# Patient Record
Sex: Male | Born: 1990 | Race: Black or African American | Hispanic: No | Marital: Single | Smoking: Never smoker
Health system: Southern US, Community
[De-identification: ages and names within clinical notes are randomized; demographics above are authoritative.]

---
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
## 2015-09-12 ENCOUNTER — Encounter (HOSPITAL_COMMUNITY): Payer: Self-pay | Admitting: Emergency Medicine

## 2015-09-12 ENCOUNTER — Emergency Department (INDEPENDENT_AMBULATORY_CARE_PROVIDER_SITE_OTHER)
Admission: EM | Admit: 2015-09-12 | Discharge: 2015-09-12 | Disposition: A | Discharge status: Home / Self Care | Payer: Self-pay | Source: Home / Self Care | Attending: Family Medicine | Admitting: Family Medicine

## 2015-09-12 DIAGNOSIS — L299 Pruritus, unspecified: Secondary | ICD-10-CM

## 2015-09-12 DIAGNOSIS — R238 Other skin changes: Secondary | ICD-10-CM

## 2015-09-12 DIAGNOSIS — L989 Disorder of the skin and subcutaneous tissue, unspecified: Secondary | ICD-10-CM

## 2015-09-12 MED ORDER — TRIAMCINOLONE ACETONIDE 0.1 % EX CREA: 1.0000 "application " | TOPICAL_CREAM | Freq: Two times a day (BID) | CUTANEOUS | Status: AC

## 2015-09-12 NOTE — ED Provider Notes (Signed)
CSN: [id number]     Arrival date & time 09/12/15  1344 History   First MD Initiated Contact with Patient 09/12/15 1533     Chief Complaint  Patient presents with  . Rash   (Consider location/radiation/quality/duration/timing/severity/associated sxs/prior Treatment) HPI Comments: 25 year old male complaining of itching from the waist down to the upper thighs. This started approximately one month ago. She states there is itching between his buttocks and his thighs. Occasionally will have some itching to the back of his hands. He has seen no rash other than a few papules to the left hand.   History reviewed. No pertinent past medical history. History reviewed. No pertinent past surgical history. No family history on file. Social History  Substance Use Topics  . Smoking status: None  . Smokeless tobacco: None  . Alcohol Use: None    Review of Systems  Constitutional: Negative for activity change.  HENT: Negative.   Respiratory: Negative.   Genitourinary: Negative.   Skin: Negative for color change and wound.  Neurological: Negative.   Psychiatric/Behavioral: Negative.     Allergies  Review of patient's allergies indicates no known allergies.  Home Medications   Prior to Admission medications   Medication Sig Start Date End Date Taking? Authorizing Provider  triamcinolone cream (KENALOG) 0.1 % Apply 1 application topically 2 (two) times daily. 09/12/15   Hayden Rasmussenavid Adanna Zuckerman, NP   Meds Ordered and Administered this Visit  Medications - No data to display  BP 124/72 mmHg  Pulse 80  Temp(Src) 97.7 F (36.5 C) (Oral)  SpO2 98% No data found.   Physical Exam  Constitutional: He is oriented to person, place, and time. He appears well-developed and well-nourished. No distress.  Neck: Normal range of motion. Neck supple.  Cardiovascular: Normal rate.   Pulmonary/Chest: Effort normal. No respiratory distress.  Neurological: He is alert and oriented to person, place, and time. He  exhibits normal muscle tone.  Skin: Skin is warm and dry.  Examination of the gluteal skin folds, inguinal and medial thighs reveal a few isolated flesh-colored papules. No erythema seen. No other rash or abnormalities of the scan. The right hand has 2-3 small papules adjacent to the thumb. No lesions to the genitalia.  Psychiatric: He has a normal mood and affect.  Nursing note and vitals reviewed.   ED Course  Procedures (including critical care time)  Labs Review Labs Reviewed - No data to display  Imaging Review No results found.   Visual Acuity Review  Right Eye Distance:   Left Eye Distance:   Bilateral Distance:    Right Eye Near:   Left Eye Near:    Bilateral Near:         MDM   1. Itching   2. Papules    Possibly a heat type rash/reaction. Little to no skin changes to make a good diagnosis for etio for itching. Try triamcinolone.    Hayden Rasmussenavid Suriya Kovarik, NP 09/12/15 1622

## 2015-09-12 NOTE — ED Notes (Signed)
Complains of itchy, rash in groin area for a month

## 2015-09-12 NOTE — Discharge Instructions (Signed)
Pruritus Pruritus is an itching feeling. There are many different conditions and factors that can make your skin itchy. Dry skin is one of the most common causes of itching. Most cases of itching do not require medical attention. Itchy skin can turn into a rash.  HOME CARE INSTRUCTIONS  Watch your pruritus for any changes. Take these steps to help with your condition:  Skin Care  Moisturize your skin as needed. A moisturizer that contains petroleum jelly is best for keeping moisture in your skin.  Take or apply medicines only as directed by your health care provider. This may include:  Corticosteroid cream.  Anti-itch lotions.  Oral anti-histamines.  Apply cool compresses to the affected areas.  Try taking a bath with:  Epsom salts. Follow the instructions on the packaging. You can get these at your local pharmacy or grocery store.  Baking soda. Pour a small amount into the bath as directed by your health care provider.  Colloidal oatmeal. Follow the instructions on the packaging. You can get this at your local pharmacy or grocery store.  Try applying baking soda paste to your skin. Stir water into baking soda until it reaches a paste-like consistency.   Do not scratch your skin.  Avoid hot showers or baths, which can make itching worse. A cold shower may help with itching as long as you use a moisturizer after.  Avoid scented soaps, detergents, and perfumes. Use gentle soaps, detergents, perfumes, and other cosmetic products. General Instructions  Avoid wearing tight clothes.  Keep a journal to help track what causes your itch. Write down:  What you eat.  What cosmetic products you use.  What you drink.  What you wear. This includes jewelry.  Use a humidifier. This keeps the air moist, which helps to prevent dry skin. SEEK MEDICAL CARE IF:  The itching does not go away after several days.  You sweat at night.  You have weight loss.  You are unusually  thirsty.  You urinate more than normal.  You are more tired than normal.  You have abdominal pain.  Your skin tingles.  You feel weak.  Your skin or the whites of your eyes look yellow (jaundice).  Your skin feels numb.   This information is not intended to replace advice given to you by your health care provider. Make sure you discuss any questions you have with your health care provider.   Document Released: 04/25/2011 Document Revised: 12/28/2014 Document Reviewed: 08/09/2014 Elsevier Interactive Patient Education 2016 [employer]  Hives Hives are itchy, red, puffy (swollen) areas of the skin. Hives can change in size and location on your body. Hives can come and go for hours, days, or weeks. Hives do not spread from person to person (noncontagious). Scratching, exercise, and stress can make your hives worse. HOME CARE  Avoid things that cause your hives (triggers).  Take antihistamine medicines as told by your doctor. Do not drive while taking an antihistamine.  Take any other medicines for itching as told by your doctor.  Wear loose-fitting clothing.  Keep all doctor visits as told. GET HELP RIGHT AWAY IF:   You have a fever.  Your tongue or lips are puffy.  You have trouble breathing or swallowing.  You feel tightness in the throat or chest.  You have belly (abdominal) pain.  You have lasting or severe itching that is not helped by medicine.  You have painful or puffy joints. These problems may be the first sign of a life-threatening  allergic reaction. Call your local emergency services (911 in [place] MAKE SURE YOU:   Understand these instructions.  Will watch your condition.  Will get help right away if you are not doing well or get worse.   This information is not intended to replace advice given to you by your health care provider. Make sure you discuss any questions you have with your health care provider.   Document Released: 05/22/2008  Document Revised: 02/12/2012 Document Reviewed: 11/06/2011 Elsevier Interactive Patient Education [employer]

## 2015-09-26 ENCOUNTER — Emergency Department (HOSPITAL_COMMUNITY)
Admission: EM | Admit: 2015-09-26 | Discharge: 2015-09-26 | Disposition: A | Discharge status: Home / Self Care | Payer: Medicare (Managed Care) | Attending: Emergency Medicine | Admitting: Emergency Medicine

## 2015-09-26 ENCOUNTER — Encounter (HOSPITAL_COMMUNITY): Payer: Self-pay

## 2015-09-26 DIAGNOSIS — R21 Rash and other nonspecific skin eruption: Secondary | ICD-10-CM | POA: Diagnosis present

## 2015-09-26 DIAGNOSIS — B86 Scabies: Secondary | ICD-10-CM | POA: Diagnosis not present

## 2015-09-26 DIAGNOSIS — Z7952 Long term (current) use of systemic steroids: Secondary | ICD-10-CM | POA: Diagnosis not present

## 2015-09-26 MED ORDER — HYDROXYZINE HCL 25 MG PO TABS: 25.0000 mg | ORAL_TABLET | Freq: Four times a day (QID) | ORAL | Status: AC | PRN

## 2015-09-26 MED ORDER — PERMETHRIN 5 % EX CREA: TOPICAL_CREAM | CUTANEOUS | Status: AC

## 2015-09-26 MED ORDER — PREDNISONE 20 MG PO TABS: ORAL_TABLET | ORAL | Status: AC

## 2015-09-26 NOTE — ED Provider Notes (Signed)
CSN: [id number]     Arrival date & time 09/26/15  0251 History  By signing my name below, I, Terrance Branch, attest that this documentation has been prepared under the direction and in the presence of Gilda Crease, MD. Electronically Signed: Evon Slack, ED Scribe. 09/26/2015. 3:05 AM.   Chief Complaint  Patient presents with  . Rash    The history is provided by the patient. No language interpreter was used.   HPI Comments: Epimenio Schetter is a 25 y.o. male who presents to the Emergency Department complaining of worsening itchy rash onset 1 month prior. Pt states that the rash bean in his groin area is spreading to his arms. Pt states that was prescribed Kenalog cream with no relief. Pt does report recently been around someone with similar rash. Pt doesn't report any other symptoms.   No past medical history on file. No past surgical history on file. No family history on file. Social History  Substance Use Topics  . Smoking status: Not on file  . Smokeless tobacco: Not on file  . Alcohol Use: Not on file    Review of Systems  Skin: Positive for rash.  All other systems reviewed and are negative.     Allergies  Review of patient's allergies indicates no known allergies.  Home Medications   Prior to Admission medications   Medication Sig Start Date End Date Taking? Authorizing Provider  hydrOXYzine (ATARAX/VISTARIL) 25 MG tablet Take 1 tablet (25 mg total) by mouth every 6 (six) hours as needed for itching. 09/26/15   Gilda Crease, MD  permethrin (ELIMITE) 5 % cream Apply to entire body once in morning and leave on for at least 12 hours. Repeat once in 2 weeks if rash returns. 09/26/15   Gilda Crease, MD  predniSONE (DELTASONE) 20 MG tablet 3 tabs po daily x 3 days, then 2 tabs x 3 days, then 1.5 tabs x 3 days, then 1 tab x 3 days, then 0.5 tabs x 3 days 09/26/15   Gilda Crease, MD  triamcinolone cream (KENALOG) 0.1 % Apply 1 application  topically 2 (two) times daily. 09/12/15   Hayden Rasmussen, NP   BP 139/79 mmHg  Pulse 64  Temp(Src) 97.6 F (36.4 C) (Oral)  Resp 16  SpO2 99%   Physical Exam  Constitutional: He is oriented to person, place, and time. He appears well-developed and well-nourished. No distress.  HENT:  Head: Normocephalic and atraumatic.  Right Ear: Hearing normal.  Left Ear: Hearing normal.  Nose: Nose normal.  Mouth/Throat: Oropharynx is clear and moist and mucous membranes are normal.  Eyes: Conjunctivae and EOM are normal. Pupils are equal, round, and reactive to light.  Neck: Normal range of motion. Neck supple.  Cardiovascular: Regular rhythm, S1 normal and S2 normal.  Exam reveals no gallop and no friction rub.   No murmur heard. Pulmonary/Chest: Effort normal and breath sounds normal. No respiratory distress. He exhibits no tenderness.  Abdominal: Soft. Normal appearance and bowel sounds are normal. There is no hepatosplenomegaly. There is no tenderness. There is no rebound, no guarding, no tenderness at McBurney's point and negative Murphy's sign. No hernia.  Musculoskeletal: Normal range of motion.  Neurological: He is alert and oriented to person, place, and time. He has normal strength. No cranial nerve deficit or sensory deficit. Coordination normal. GCS eye subscore is 4. GCS verbal subscore is 5. GCS motor subscore is 6.  Skin: Skin is warm, dry and intact. Rash noted. No  cyanosis.  Diffuse papular rash with excoriations concentrated in finger webs and folds of skin  Psychiatric: He has a normal mood and affect. His speech is normal and behavior is normal. Thought content normal.  Nursing note and vitals reviewed.   ED Course  Procedures (including critical care time) DIAGNOSTIC STUDIES: Oxygen Saturation is 99% on RA, normal by my interpretation.    COORDINATION OF CARE: 3:06 AM-Discussed treatment plan with pt at bedside and pt agreed to plan.     Labs Review Labs Reviewed - No  data to display  Imaging Review No results found.    EKG Interpretation None      MDM   Final diagnoses:  Scabies      I personally performed the services described in this documentation, which was scribed in my presence. The recorded information has been reviewed and is accurate.      Gilda Crease, MD 09/26/15 [phone]

## 2015-09-26 NOTE — Discharge Instructions (Signed)

## 2017-04-19 ENCOUNTER — Encounter (HOSPITAL_COMMUNITY): Payer: Self-pay | Admitting: Emergency Medicine

## 2017-04-19 ENCOUNTER — Emergency Department (HOSPITAL_COMMUNITY): Payer: Medicaid - Out of State

## 2017-04-19 ENCOUNTER — Emergency Department (HOSPITAL_COMMUNITY)
Admission: EM | Admit: 2017-04-19 | Discharge: 2017-04-19 | Disposition: A | Discharge status: Home / Self Care | Payer: Medicaid - Out of State | Attending: Physician Assistant | Admitting: Physician Assistant

## 2017-04-19 DIAGNOSIS — M25512 Pain in left shoulder: Secondary | ICD-10-CM

## 2017-04-19 DIAGNOSIS — Z79899 Other long term (current) drug therapy: Secondary | ICD-10-CM | POA: Insufficient documentation

## 2017-04-19 MED ORDER — ACETAMINOPHEN 500 MG PO TABS
500.0000 mg | ORAL_TABLET | Freq: Once | ORAL | Status: AC
  Administered 2017-04-19: 500 mg via ORAL
  Filled 2017-04-19: qty 1

## 2017-04-19 MED ORDER — IBUPROFEN 400 MG PO TABS
400.0000 mg | ORAL_TABLET | Freq: Once | ORAL | Status: AC
  Administered 2017-04-19: 400 mg via ORAL
  Filled 2017-04-19: qty 1

## 2017-04-19 MED ORDER — OXYCODONE-ACETAMINOPHEN 5-325 MG PO TABS
1.0000 | ORAL_TABLET | Freq: Once | ORAL | Status: AC
Start: 2017-04-19 — End: 2017-04-19
  Administered 2017-04-19: 1 via ORAL
  Filled 2017-04-19: qty 1

## 2017-04-19 NOTE — ED Notes (Signed)
Pt ambulated off floor.

## 2017-04-19 NOTE — ED Provider Notes (Signed)
[facility]-EMERGENCY DEPT Provider Note   CSN: [id number] Arrival date & time: 04/19/17  0116     History   Chief Complaint Chief Complaint  Patient presents with  . Shoulder Pain    HPI Kevin Mcneil is a 26 y.o. male with no pmh presents to ED for evaluation of sudden onset, constant, non radiating left shoulder pain yesterday. Aggravating factors include left shoulder flexion, abduction and movements over head. No alleviating factors. Has not tried any OTC pain medications or ice. States he had first day of physical training for [employer] day before pain started. He did "thousdands" of push ups. Denies falls or direct injury. States his entire upper body is sore from training. Denies neck pain, chest wall pain, cough, chest pain, chest tightness, recent URI illness, numbness/weakness/tingling distally. No previous shoulder surgeries or injury. No h/o DM or IVDU.   HPI  History reviewed. No pertinent past medical history.  There are no active problems to display for this patient.   Past Surgical History:  Procedure Laterality Date  . FEMUR FRACTURE SURGERY Right 2009       Home Medications    Prior to Admission medications   Medication Sig Start Date End Date Taking? Authorizing Provider  hydrOXYzine (ATARAX/VISTARIL) 25 MG tablet Take 1 tablet (25 mg total) by mouth every 6 (six) hours as needed for itching. 09/26/15   Gilda Crease, MD  permethrin (ELIMITE) 5 % cream Apply to entire body once in morning and leave on for at least 12 hours. Repeat once in 2 weeks if rash returns. 09/26/15   Gilda Crease, MD  predniSONE (DELTASONE) 20 MG tablet 3 tabs po daily x 3 days, then 2 tabs x 3 days, then 1.5 tabs x 3 days, then 1 tab x 3 days, then 0.5 tabs x 3 days 09/26/15   Gilda Crease, MD  triamcinolone cream (KENALOG) 0.1 % Apply 1 application topically 2 (two) times daily. 09/12/15   Hayden Rasmussen, NP    Family History No family history on file.  Social  History Social History  Substance Use Topics  . Smoking status: Never Smoker  . Smokeless tobacco: Never Used  . Alcohol use No     Allergies   Patient has no known allergies.   Review of Systems Review of Systems  Constitutional: Negative for chills and fever.  HENT: Negative for congestion and sore throat.   Respiratory: Negative for cough, chest tightness and shortness of breath.   Cardiovascular: Negative for chest pain.  Gastrointestinal: Negative for abdominal pain.  Musculoskeletal: Positive for arthralgias and myalgias.  Skin: Negative for color change and rash.     Physical Exam Updated Vital Signs BP 131/87 (BP Location: Left Arm)   Pulse 88   Temp 98.7 F (37.1 C) (Oral)   Resp 16   Ht 5\' 11"  (1.803 m)   Wt 93.4 kg (206 lb)   SpO2 100%   BMI 28.73 kg/m   Physical Exam  Constitutional: He is oriented to person, place, and time. He appears well-developed and well-nourished. No distress.  NAD.  HENT:  Head: Normocephalic and atraumatic.  Right Ear: External ear normal.  Left Ear: External ear normal.  Nose: Nose normal.  Eyes: Conjunctivae and EOM are normal. No scleral icterus.  Neck: Normal range of motion. Neck supple.  Cardiovascular: Normal rate, regular rhythm, normal heart sounds and intact distal pulses.   No murmur heard. Pulmonary/Chest: Effort normal and breath sounds normal. He has no wheezes.  Musculoskeletal: Normal range of motion. He exhibits tenderness. He exhibits no deformity.  +Tenderness to LEFT trapezius, deltoid, biceps tendon  +Full PROM of LEFT shoulder, pain with F/ABD +Positive Neer's, Hawkin's and Speed's tests No obvious skin abnormalities including abrasions, ecchymosis, erythema, edema No point tenderness to sternum, anterior chest wall, scapula, clavicle, AC or [place] joints Negative drop arm test and O'brien test  Neurological: He is alert and oriented to person, place, and time.  Skin: Skin is warm and dry. Capillary  refill takes less than 2 seconds.  Psychiatric: He has a normal mood and affect. His behavior is normal. Judgment and thought content normal.  Nursing note and vitals reviewed.    ED Treatments / Results  Labs (all labs ordered are listed, but only abnormal results are displayed) Labs Reviewed - No data to display  EKG  EKG Interpretation None       Radiology Dg Shoulder Left  Result Date: 04/19/2017 CLINICAL DATA:  Left shoulder pain following PET on Tuesday. EXAM: LEFT SHOULDER - 2+ VIEW COMPARISON:  None. FINDINGS: There is no evidence of fracture or dislocation. There is no evidence of arthropathy or other focal bone abnormality. Soft tissues are unremarkable. IMPRESSION: Negative radiographs of the left shoulder. Electronically Signed   By: Rubye Oaks M.D.   On: 04/19/2017 01:54    Procedures Procedures (including critical care time)  Medications Ordered in ED Medications  ibuprofen (ADVIL,MOTRIN) tablet 400 mg (not administered)  acetaminophen (TYLENOL) tablet 500 mg (not administered)  oxyCODONE-acetaminophen (PERCOCET/ROXICET) 5-325 MG per tablet 1 tablet (not administered)     Initial Impression / Assessment and Plan / ED Course  I have reviewed the triage vital signs and the nursing notes.  Pertinent labs & imaging results that were available during my care of the patient were reviewed by me and considered in my medical decision making (see chart for details).    26 year old male presents to the ED for evaluation of sudden onset left shoulder pain, nonradiating. 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 He is left-handed. Exam shows signs of possible biceps tendinitis and subacromial impingement given positive Neer's, Hawkin's and Speed's signs. Less likely rotator cuff tear or SLAP lesion. No evidence of cellulitis, septic arthritis or gout on exam. Will d/c with conservative tx, sling, RICE and f/u with  PCP for worsening or persistent symptoms. Pt verbalized understanding and agreeable with plan.   Final Clinical Impressions(s) / ED Diagnoses   Final diagnoses:  Acute pain of left shoulder    New Prescriptions New Prescriptions   No medications on file     Jerrell Mylar 04/19/17 0539    Abelino Derrick, MD 04/19/17 [id number]

## 2017-04-19 NOTE — ED Triage Notes (Signed)
Pt c/o L shoulder pain sinceTuesday, worsening today after waking up. 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 CSM intact.

## 2017-04-19 NOTE — Discharge Instructions (Signed)
Your evaluated in the emergency department for left shoulder pain after increased exertional activity. Your x-ray did not show dislocations, fractures, fluids, arthritis. Given your recent physical activity and exam findings, I suspect your pain is from normal muscular soreness after exertion or soft tissue injury including ligamentous inflammation or tear.  For pain please take 400 mg of ibuprofen +1000 mg of Tylenol every 8 hours. Wear sling for the next 48 hours and avoid any activity that exacerbates pain. Ice for a minimum of 20 minutes as often as possible during the day. After 48 hours of rest and wearing sling, start performing light range of motion exercises to avoid joint stiffness.

## 2017-04-19 NOTE — ED Notes (Signed)
PA at bedside.

## 2017-04-19 NOTE — ED Notes (Signed)
Ortho tech responded to page & will come bring & apply sling per order

## 2018-01-27 IMAGING — DX DG SHOULDER 2+V*L*
2 series · 2 of 2 positions shown · non-contrast
Comparison: None.

CLINICAL DATA: Left shoulder pain following PET on [REDACTED].

EXAM:
LEFT SHOULDER - 2+ VIEW

[shoulder grashey]
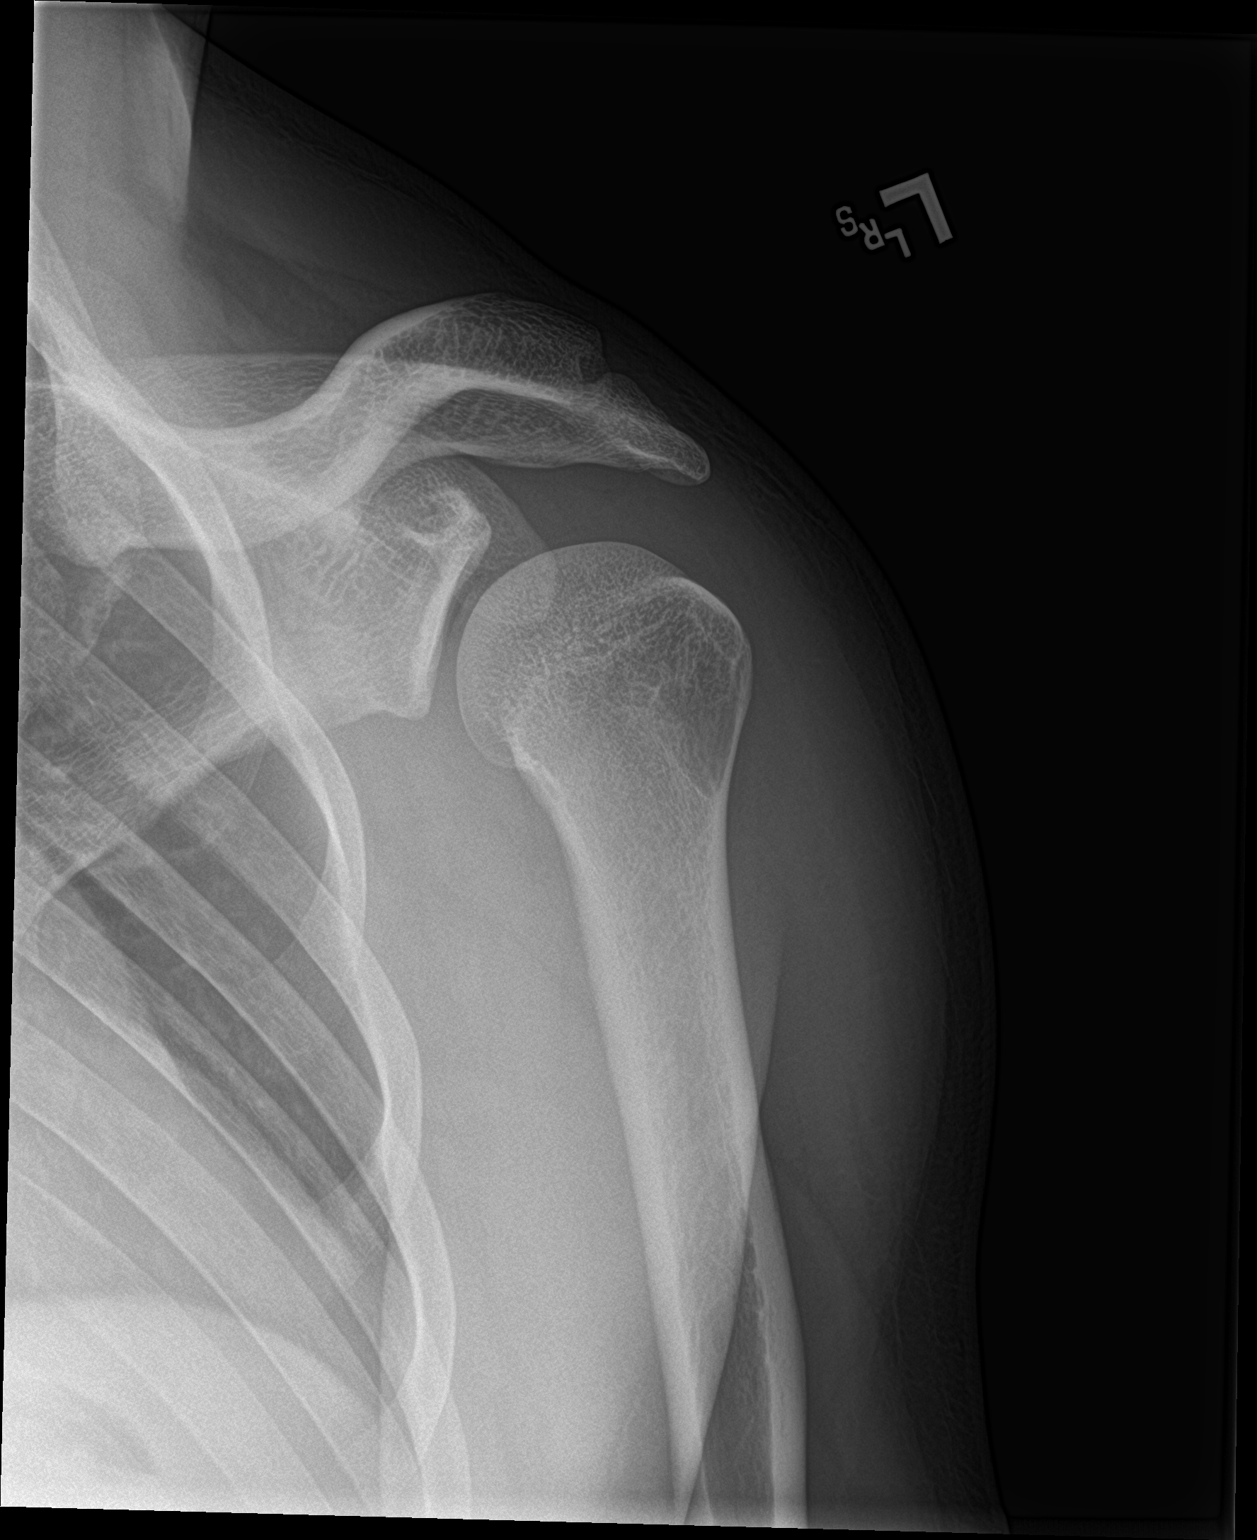

[shoulder y view]
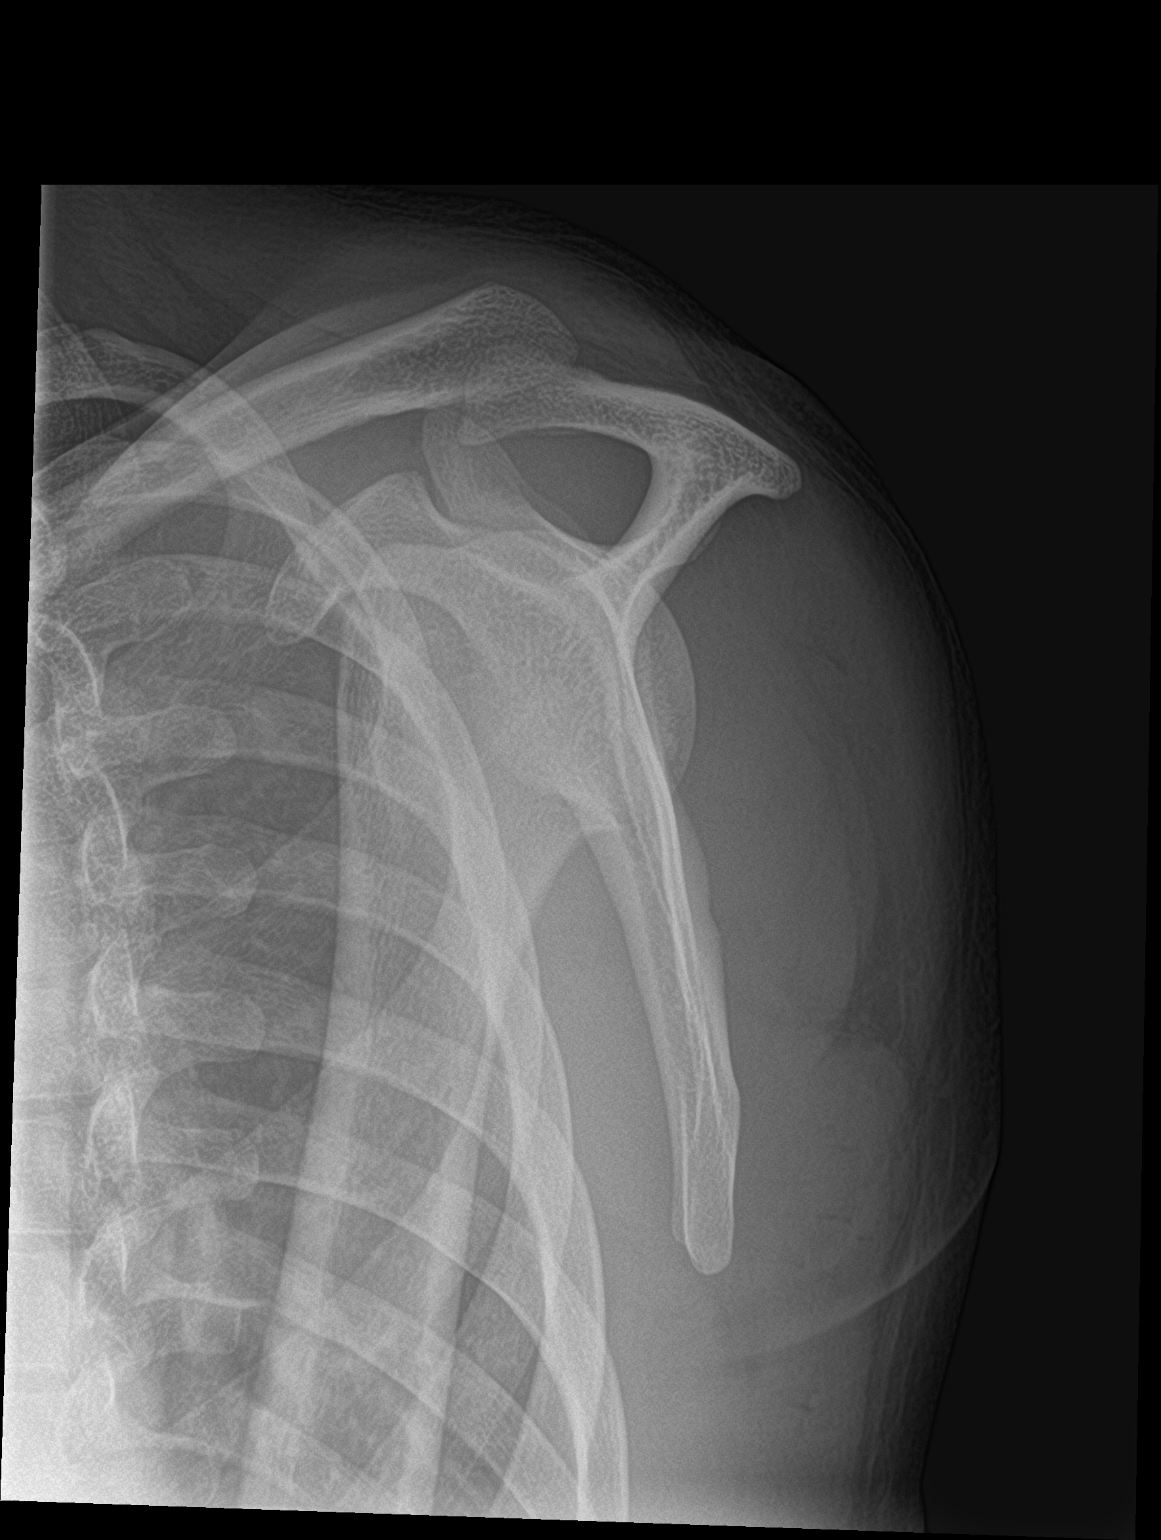

[2 of 2 positions shown; findings below may reference images not displayed]

FINDINGS: There is no evidence of fracture or dislocation. There is no
evidence of arthropathy or other focal bone abnormality. Soft
tissues are unremarkable.
IMPRESSION: Negative radiographs of the left shoulder.
